# Patient Record
Sex: Male | Born: 1970 | Race: White | Hispanic: No | Marital: Married | Smoking: Never smoker
Health system: Southern US, Community
[De-identification: ages and names within clinical notes are randomized; demographics above are authoritative.]

## PROBLEM LIST (undated history)

## (undated) DIAGNOSIS — N200 Calculus of kidney: Secondary | ICD-10-CM

## (undated) HISTORY — PX: CHOLECYSTECTOMY: SHX55

## (undated) HISTORY — PX: SHOULDER SURGERY: SHX246

## (undated) HISTORY — PX: OTHER SURGICAL HISTORY: SHX169

## (undated) HISTORY — PX: HERNIA REPAIR: SHX51

## (undated) HISTORY — PX: TONSILLECTOMY: SUR1361

---

## 1998-02-25 ENCOUNTER — Encounter: Payer: Self-pay | Admitting: Emergency Medicine

## 1998-02-25 ENCOUNTER — Inpatient Hospital Stay (HOSPITAL_COMMUNITY): Admission: EM | Admit: 1998-02-25 | Discharge: 1998-02-28 | Payer: Self-pay | Admitting: Emergency Medicine

## 2004-11-17 ENCOUNTER — Emergency Department (HOSPITAL_COMMUNITY): Admission: EM | Admit: 2004-11-17 | Discharge: 2004-11-17 | Payer: Self-pay | Admitting: Emergency Medicine

## 2004-11-18 ENCOUNTER — Ambulatory Visit (HOSPITAL_COMMUNITY): Admission: AD | Admit: 2004-11-18 | Discharge: 2004-11-18 | Payer: Self-pay | Admitting: Urology

## 2007-05-29 ENCOUNTER — Encounter: Admission: RE | Admit: 2007-05-29 | Discharge: 2007-05-29 | Payer: Self-pay | Admitting: General Surgery

## 2008-10-01 ENCOUNTER — Encounter: Admission: RE | Admit: 2008-10-01 | Discharge: 2008-10-01 | Payer: Self-pay | Admitting: Orthopedic Surgery

## 2008-10-18 ENCOUNTER — Emergency Department: Payer: Self-pay | Admitting: Emergency Medicine

## 2010-05-25 ENCOUNTER — Emergency Department (HOSPITAL_COMMUNITY)
Admission: EM | Admit: 2010-05-25 | Discharge: 2010-05-25 | Payer: Self-pay | Source: Home / Self Care | Admitting: Emergency Medicine

## 2010-05-31 LAB — CBC
HCT: 40.2 % (ref 39.0–52.0)
Hemoglobin: 13.5 g/dL (ref 13.0–17.0)
MCH: 31.5 pg (ref 26.0–34.0)
MCHC: 33.6 g/dL (ref 30.0–36.0)
MCV: 93.7 fL (ref 78.0–100.0)
Platelets: 218 10*3/uL (ref 150–400)
RBC: 4.29 MIL/uL (ref 4.22–5.81)
RDW: 13.8 % (ref 11.5–15.5)
WBC: 4.9 10*3/uL (ref 4.0–10.5)

## 2010-05-31 LAB — URINALYSIS, ROUTINE W REFLEX MICROSCOPIC
Bilirubin Urine: NEGATIVE
Hgb urine dipstick: NEGATIVE
Ketones, ur: NEGATIVE mg/dL
Nitrite: NEGATIVE
Protein, ur: NEGATIVE mg/dL
Specific Gravity, Urine: 1.011 (ref 1.005–1.030)
Urine Glucose, Fasting: NEGATIVE mg/dL
Urobilinogen, UA: 0.2 mg/dL (ref 0.0–1.0)
pH: 6 (ref 5.0–8.0)

## 2010-05-31 LAB — DIFFERENTIAL
Basophils Absolute: 0 10*3/uL (ref 0.0–0.1)
Basophils Relative: 1 % (ref 0–1)
Eosinophils Absolute: 0.2 10*3/uL (ref 0.0–0.7)
Eosinophils Relative: 5 % (ref 0–5)
Lymphocytes Relative: 36 % (ref 12–46)
Lymphs Abs: 1.8 10*3/uL (ref 0.7–4.0)
Monocytes Absolute: 0.5 10*3/uL (ref 0.1–1.0)
Monocytes Relative: 10 % (ref 3–12)
Neutro Abs: 2.4 10*3/uL (ref 1.7–7.7)
Neutrophils Relative %: 49 % (ref 43–77)

## 2010-05-31 LAB — COMPREHENSIVE METABOLIC PANEL
ALT: 15 U/L (ref 0–53)
AST: 17 U/L (ref 0–37)
Albumin: 4.3 g/dL (ref 3.5–5.2)
Alkaline Phosphatase: 66 U/L (ref 39–117)
BUN: 12 mg/dL (ref 6–23)
CO2: 27 mEq/L (ref 19–32)
Calcium: 9.8 mg/dL (ref 8.4–10.5)
Chloride: 107 mEq/L (ref 96–112)
Creatinine, Ser: 1.11 mg/dL (ref 0.4–1.5)
GFR calc Af Amer: >60 mL/min (ref >60)
GFR calc non Af Amer: >60 mL/min (ref >60)
Glucose, Bld: 79 mg/dL (ref 70–99)
Potassium: 4.1 mEq/L (ref 3.5–5.1)
Sodium: 141 mEq/L (ref 135–145)
Total Bilirubin: 0.7 mg/dL (ref 0.3–1.2)
Total Protein: 7.2 g/dL (ref 6.0–8.3)

## 2012-06-05 ENCOUNTER — Other Ambulatory Visit: Payer: Self-pay | Admitting: Gastroenterology

## 2012-06-05 ENCOUNTER — Ambulatory Visit
Admission: RE | Admit: 2012-06-05 | Discharge: 2012-06-05 | Disposition: A | Discharge status: Home / Self Care | Payer: BC Managed Care – PPO | Source: Ambulatory Visit | Attending: Gastroenterology | Admitting: Gastroenterology

## 2012-06-05 DIAGNOSIS — R1310 Dysphagia, unspecified: Secondary | ICD-10-CM

## 2015-07-28 ENCOUNTER — Encounter: Payer: Self-pay | Admitting: *Deleted

## 2015-07-28 ENCOUNTER — Emergency Department
Admission: EM | Admit: 2015-07-28 | Discharge: 2015-07-28 | Disposition: A | Discharge status: Home / Self Care | Payer: Worker's Compensation | Attending: Student | Admitting: Student

## 2015-07-28 ENCOUNTER — Emergency Department: Payer: Worker's Compensation

## 2015-07-28 DIAGNOSIS — Y998 Other external cause status: Secondary | ICD-10-CM | POA: Diagnosis not present

## 2015-07-28 DIAGNOSIS — S29002A Unspecified injury of muscle and tendon of back wall of thorax, initial encounter: Secondary | ICD-10-CM | POA: Diagnosis not present

## 2015-07-28 DIAGNOSIS — Y92410 Unspecified street and highway as the place of occurrence of the external cause: Secondary | ICD-10-CM | POA: Insufficient documentation

## 2015-07-28 DIAGNOSIS — S2232XA Fracture of one rib, left side, initial encounter for closed fracture: Secondary | ICD-10-CM | POA: Diagnosis not present

## 2015-07-28 DIAGNOSIS — Y9389 Activity, other specified: Secondary | ICD-10-CM | POA: Insufficient documentation

## 2015-07-28 DIAGNOSIS — S199XXA Unspecified injury of neck, initial encounter: Secondary | ICD-10-CM | POA: Diagnosis not present

## 2015-07-28 DIAGNOSIS — S3992XA Unspecified injury of lower back, initial encounter: Secondary | ICD-10-CM | POA: Diagnosis not present

## 2015-07-28 DIAGNOSIS — S29001A Unspecified injury of muscle and tendon of front wall of thorax, initial encounter: Secondary | ICD-10-CM | POA: Diagnosis present

## 2015-07-28 HISTORY — DX: Calculus of kidney: N20.0

## 2015-07-28 LAB — URINE DRUG SCREEN, QUALITATIVE (ARMC ONLY)
Amphetamines, Ur Screen: NOT DETECTED
Barbiturates, Ur Screen: NOT DETECTED
Benzodiazepine, Ur Scrn: NOT DETECTED
Cannabinoid 50 Ng, Ur ~~LOC~~: NOT DETECTED
Cocaine Metabolite,Ur ~~LOC~~: NOT DETECTED
MDMA (Ecstasy)Ur Screen: NOT DETECTED
Methadone Scn, Ur: NOT DETECTED
Opiate, Ur Screen: NOT DETECTED
Phencyclidine (PCP) Ur S: NOT DETECTED
Tricyclic, Ur Screen: NOT DETECTED

## 2015-07-28 MED ORDER — IBUPROFEN 600 MG PO TABS
600.0000 mg | ORAL_TABLET | Freq: Once | ORAL | Status: AC
  Administered 2015-07-28: 600 mg via ORAL
  Filled 2015-07-28: qty 1

## 2015-07-28 MED ORDER — OXYCODONE HCL 5 MG PO TABS: 5.0000 mg | ORAL_TABLET | Freq: Four times a day (QID) | ORAL | Status: AC | PRN
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
## 2016-03-06 ENCOUNTER — Emergency Department (HOSPITAL_COMMUNITY): Payer: BLUE CROSS/BLUE SHIELD

## 2016-03-06 ENCOUNTER — Emergency Department (HOSPITAL_COMMUNITY)
Admission: EM | Admit: 2016-03-06 | Discharge: 2016-03-07 | Disposition: A | Discharge status: Home / Self Care | Payer: BLUE CROSS/BLUE SHIELD | Attending: Emergency Medicine | Admitting: Emergency Medicine

## 2016-03-06 ENCOUNTER — Encounter (HOSPITAL_COMMUNITY): Payer: Self-pay | Admitting: Emergency Medicine

## 2016-03-06 DIAGNOSIS — Z79899 Other long term (current) drug therapy: Secondary | ICD-10-CM | POA: Diagnosis not present

## 2016-03-06 DIAGNOSIS — N200 Calculus of kidney: Secondary | ICD-10-CM | POA: Insufficient documentation

## 2016-03-06 DIAGNOSIS — R109 Unspecified abdominal pain: Secondary | ICD-10-CM | POA: Diagnosis present

## 2016-03-06 DIAGNOSIS — R319 Hematuria, unspecified: Secondary | ICD-10-CM | POA: Insufficient documentation

## 2016-03-06 LAB — I-STAT CHEM 8, ED
BUN: 17 mg/dL (ref 6–20)
CHLORIDE: 104 mmol/L (ref 101–111)
Calcium, Ion: 1.15 mmol/L (ref 1.15–1.40)
Creatinine, Ser: 1 mg/dL (ref 0.61–1.24)
GLUCOSE: 99 mg/dL (ref 65–99)
HCT: 43 % (ref 39.0–52.0)
Hemoglobin: 14.6 g/dL (ref 13.0–17.0)
POTASSIUM: 4 mmol/L (ref 3.5–5.1)
Sodium: 140 mmol/L (ref 135–145)
TCO2: 25 mmol/L (ref 0–100)

## 2016-03-06 LAB — URINE MICROSCOPIC-ADD ON
BACTERIA UA: NONE SEEN
SQUAMOUS EPITHELIAL / LPF: NONE SEEN

## 2016-03-06 LAB — CBC WITH DIFFERENTIAL/PLATELET
BASOS ABS: 0 10*3/uL (ref 0.0–0.1)
Basophils Relative: 0 %
EOS PCT: 2 %
Eosinophils Absolute: 0.1 10*3/uL (ref 0.0–0.7)
HEMATOCRIT: 42.9 % (ref 39.0–52.0)
Hemoglobin: 15 g/dL (ref 13.0–17.0)
LYMPHS PCT: 17 %
Lymphs Abs: 1.2 10*3/uL (ref 0.7–4.0)
MCH: 31.5 pg (ref 26.0–34.0)
MCHC: 35 g/dL (ref 30.0–36.0)
MCV: 90.1 fL (ref 78.0–100.0)
Monocytes Absolute: 0.5 10*3/uL (ref 0.1–1.0)
Monocytes Relative: 7 %
NEUTROS ABS: 5.3 10*3/uL (ref 1.7–7.7)
Neutrophils Relative %: 74 %
PLATELETS: 210 10*3/uL (ref 150–400)
RBC: 4.76 MIL/uL (ref 4.22–5.81)
RDW: 12.8 % (ref 11.5–15.5)
WBC: 7.1 10*3/uL (ref 4.0–10.5)

## 2016-03-06 LAB — URINALYSIS, ROUTINE W REFLEX MICROSCOPIC
Bilirubin Urine: NEGATIVE
Glucose, UA: NEGATIVE mg/dL
Ketones, ur: NEGATIVE mg/dL
Leukocytes, UA: NEGATIVE
Nitrite: NEGATIVE
Protein, ur: NEGATIVE mg/dL
Specific Gravity, Urine: 1.006 (ref 1.005–1.030)
pH: 5.5 (ref 5.0–8.0)

## 2016-03-06 MED ORDER — OXYCODONE-ACETAMINOPHEN 5-325 MG PO TABS
1.0000 | ORAL_TABLET | ORAL | Status: DC | PRN
  Administered 2016-03-06: 1 via ORAL
  Filled 2016-03-06: qty 1

## 2016-03-06 MED ORDER — HYDROMORPHONE HCL 1 MG/ML IJ SOLN
0.5000 mg | Freq: Once | INTRAMUSCULAR | Status: AC
  Administered 2016-03-06: 0.5 mg via INTRAVENOUS
  Filled 2016-03-06: qty 1

## 2016-03-06 MED ORDER — ONDANSETRON HCL 4 MG/2ML IJ SOLN
4.0000 mg | Freq: Once | INTRAMUSCULAR | Status: AC
  Administered 2016-03-06: 4 mg via INTRAVENOUS
  Filled 2016-03-06: qty 2

## 2016-03-06 MED ORDER — KETOROLAC TROMETHAMINE 30 MG/ML IJ SOLN
15.0000 mg | Freq: Once | INTRAMUSCULAR | Status: AC
  Administered 2016-03-06: 15 mg via INTRAVENOUS
  Filled 2016-03-06: qty 1

## 2016-03-06 NOTE — ED Triage Notes (Signed)
Pt states that he has had R sided flank pain since 11 oclock this morning. Hx of kidney stones in the past. Alert and oriented.

## 2016-03-06 NOTE — ED Notes (Signed)
Bed: [id number] Expected date: 03/06/16 Expected time: 5:39 PM Means of arrival: Ambulance Comments: N/V

## 2016-03-06 NOTE — ED Notes (Signed)
Pt reports that his normal BP is around 90/60 or lower and his HR is around 60 or lower.

## 2016-03-06 NOTE — ED Provider Notes (Addendum)
[facility]-EMERGENCY DEPT Provider Note   CSN: [id number] Arrival date & time: 03/06/16  1659     History   Chief Complaint Chief Complaint  Patient presents with  . Flank Pain    HPI Sean Gonzales is a 45 y.o. male.  The history is provided by the patient.  Flank Pain  Pertinent negatives include no chest pain and no abdominal pain.  Patient presents with right flank pain. Began yesterday but worse today. Was somewhat dull yesterday but at around 11:00 today a more severe. Feels like previous kidney stones. Last stone was several years ago. Sees Dr. Wynelle Link from [facility]. His had nausea. Had Percocet through triage and states he feels little better. No diarrhea or constipation.  Past Medical History:  Diagnosis Date  . Kidney stones     There are no active problems to display for this patient.   Past Surgical History:  Procedure Laterality Date  . CHOLECYSTECTOMY    . HERNIA REPAIR    . kidney stone removal    . SHOULDER SURGERY Right   . TONSILLECTOMY         Home Medications    Prior to Admission medications   Medication Sig Start Date End Date Taking? Authorizing Provider  ibuprofen (ADVIL,MOTRIN) 200 MG tablet Take 400 mg by mouth every 6 (six) hours as needed for headache.   Yes Historical Provider, MD  oxyCODONE (ROXICODONE) 5 MG immediate release tablet Take 1 tablet (5 mg total) by mouth every 6 (six) hours as needed for moderate pain. Do not drive while taking this medication. Patient not taking: Reported on 03/06/2016 07/28/15   Gayla Doss, MD    Family History History reviewed. No pertinent family history.  Social History Social History  Substance Use Topics  . Smoking status: Never Smoker  . Smokeless tobacco: Not on file  . Alcohol use Yes     Comment: occasional     Allergies   Review of patient's allergies indicates no known allergies.   Review of Systems Review of Systems  Constitutional: Negative for appetite change.    Respiratory: Negative for chest tightness.   Cardiovascular: Negative for chest pain.  Gastrointestinal: Positive for nausea. Negative for abdominal pain.  Genitourinary: Positive for flank pain.  Musculoskeletal: Negative for back pain.  Neurological: Negative for light-headedness.     Physical Exam Updated Vital Signs BP 122/84   Pulse (!) 52   Temp 98.5 F (36.9 C) (Oral)   Resp 18   Ht 5\' 8"  (1.727 m)   Wt 188 lb (85.3 kg)   SpO2 99%   BMI 28.59 kg/m   Physical Exam  Constitutional: He appears well-developed.  HENT:  Head: Atraumatic.  Eyes: EOM are normal.  Neck: Neck supple.  Cardiovascular: Normal rate.   Pulmonary/Chest: Effort normal.  Abdominal: He exhibits no distension.  Genitourinary:  Genitourinary Comments: No CVA tenderness.  Musculoskeletal: Normal range of motion.  Neurological: He is alert.  Skin: Skin is warm. Capillary refill takes less than 2 seconds.     ED Treatments / Results  Labs (all labs ordered are listed, but only abnormal results are displayed) Labs Reviewed  URINALYSIS, ROUTINE W REFLEX MICROSCOPIC (NOT AT [facility]) - Abnormal; Notable for the following:       Result Value   Hgb urine dipstick LARGE (*)    All other components within normal limits  CBC WITH DIFFERENTIAL/PLATELET  URINE MICROSCOPIC-ADD ON  I-STAT CHEM 8, ED    EKG  EKG  Interpretation None       Radiology Dg Abdomen 1 View  Result Date: 03/06/2016 CLINICAL DATA:  Right-sided low back and flank pain for 2 days. History of kidney stones. EXAM: ABDOMEN - 1 VIEW COMPARISON:  Lumbar spine radiographs 07/28/2015. One view abdomen 04/23/2014. FINDINGS: There are no suspicious calcifications overlying the kidneys or expected course of the right ureter. Left pelvic calcification is unchanged, likely a phlebolith. There is a faint radiodensity adjacent to the left L4 transverse process which could reflect a calcification. The bowel gas pattern is normal.  Cholecystectomy clips and bone island in the left superior pubic ramus are unchanged. IMPRESSION: No acute findings. No radiographic evidence of right-sided urinary tract calculus. Potential small calcification adjacent to the left L4 transverse process. Electronically Signed   By: Carey BullocksWilliam  Veazey M.D.   On: 03/06/2016 18:31   [place] Renal  Result Date: 03/06/2016 CLINICAL DATA:  Right flank pain since this morning. EXAM: RENAL / URINARY TRACT ULTRASOUND COMPLETE COMPARISON:  None. FINDINGS: Right Kidney: Length: 10.6 cm. Echogenicity within normal limits. No mass or hydronephrosis visualized. Left Kidney: Length: 10.8 cm. Echogenicity within normal limits. No mass or hydronephrosis visualized. Bladder: Appears normal for degree of bladder distention. IMPRESSION: Normal size kidneys without hydronephrosis. Electronically Signed   By: Elberta Fortisaniel  Boyle M.D.   On: 03/06/2016 21:07    Procedures Procedures (including critical care time)  Medications Ordered in ED Medications  oxyCODONE-acetaminophen (PERCOCET/ROXICET) 5-325 MG per tablet 1 tablet (1 tablet Oral Given 03/06/16 1710)  HYDROmorphone (DILAUDID) injection 0.5 mg (not administered)  HYDROmorphone (DILAUDID) injection 0.5 mg (0.5 mg Intravenous Given 03/06/16 1826)  HYDROmorphone (DILAUDID) injection 0.5 mg (0.5 mg Intravenous Given 03/06/16 2012)  ketorolac (TORADOL) 30 MG/ML injection 15 mg (15 mg Intravenous Given 03/06/16 2012)  ondansetron (ZOFRAN) injection 4 mg (4 mg Intravenous Given 03/06/16 2012)  HYDROmorphone (DILAUDID) injection 0.5 mg (0.5 mg Intravenous Given 03/06/16 2243)  iopamidol (ISOVUE-300) 61 % injection 100 mL (100 mLs Intravenous Contrast Given 03/07/16 0032)     Initial Impression / Assessment and Plan / ED Course  I have reviewed the triage vital signs and the nursing notes.  Pertinent labs & imaging results that were available during my care of the patient were reviewed by me and considered in my medical decision  making (see chart for details).  Clinical Course    Patient with right flank pain. Feels like previous kidney stones. Does have hematuria however KUB did not show stone on the right side. Renal ultrasound did not show hydronephrosis. CT scan was done with contrast. His been required repeat doses of pain medicine. Care turned over to Dr Jacqulyn BathLong. Patient states he has never passed a stone on its own and has had stents and retrieval in the past.  Final Clinical Impressions(s) / ED Diagnoses   Final diagnoses:  Right flank pain  Hematuria, unspecified type    New Prescriptions New Prescriptions   No medications on file     Benjiman CoreNathan Breylan Lefevers, MD 03/07/16 0041    Benjiman CoreNathan Nahiara Kretzschmar, MD 03/07/16 [phone]

## 2016-03-06 NOTE — ED Triage Notes (Signed)
Pain medication given in Triage. Patient advised about side effects of medications and  to avoid driving for a minimum of 4 hours.  

## 2016-03-06 NOTE — ED Notes (Signed)
US at bedside

## 2016-03-06 NOTE — ED Notes (Signed)
RN at bedside starting IV will collect labs 

## 2016-03-07 ENCOUNTER — Emergency Department (HOSPITAL_COMMUNITY): Payer: BLUE CROSS/BLUE SHIELD

## 2016-03-07 ENCOUNTER — Encounter (HOSPITAL_COMMUNITY): Payer: Self-pay

## 2016-03-07 DIAGNOSIS — N200 Calculus of kidney: Secondary | ICD-10-CM | POA: Diagnosis not present

## 2016-03-07 MED ORDER — TAMSULOSIN HCL 0.4 MG PO CAPS: 0.4000 mg | ORAL_CAPSULE | Freq: Every day | ORAL | 0 refills | Status: DC

## 2016-03-07 MED ORDER — OXYCODONE-ACETAMINOPHEN 5-325 MG PO TABS: 1.0000 | ORAL_TABLET | ORAL | 0 refills | Status: DC | PRN

## 2016-03-07 MED ORDER — HYDROMORPHONE HCL 1 MG/ML IJ SOLN
0.5000 mg | Freq: Once | INTRAMUSCULAR | Status: AC
  Administered 2016-03-07: 0.5 mg via INTRAVENOUS
  Filled 2016-03-07: qty 1

## 2016-03-07 MED ORDER — IOPAMIDOL (ISOVUE-300) INJECTION 61%
100.0000 mL | Freq: Once | INTRAVENOUS | Status: AC | PRN
  Administered 2016-03-07: 100 mL via INTRAVENOUS

## 2016-03-07 NOTE — Discharge Instructions (Signed)
You have been seen in the Emergency Department (ED) today for pain that we believe based on your workup, is caused by kidney stones.  As we have discussed, please drink plenty of fluids.  Please make a follow up appointment with the physician(s) listed elsewhere in this documentation. ° °You may take pain medication as needed but ONLY as prescribed.  Please also take your prescribed Flomax daily.  We also recommend that you take over-the-counter ibuprofen regularly according to label instructions over the next 5 days.  Take it with meals to minimize stomach discomfort. ° °Please see your doctor as soon as possible as stones may take 1-3 weeks to pass and you may require additional care or medications. ° °Do not drink alcohol, drive or participate in any other potentially dangerous activities while taking opiate pain medication as it may make you sleepy. Do not take this medication with any other sedating medications, either prescription or over-the-counter. If you were prescribed Percocet or Vicodin, do not take these with acetaminophen (Tylenol) as it is already contained within these medications. °  °Take Percocet as needed for severe pain.  This medication is an opiate (or narcotic) pain medication and can be habit forming.  Use it as little as possible to achieve adequate pain control.  Do not use or use it with extreme caution if you have a history of opiate abuse or dependence.  If you are on a pain contract with your primary care doctor or a pain specialist, be sure to let them know you were prescribed this medication today from the Emergency Department.  This medication is intended for your use only - do not give any to anyone else and keep it in a secure place where nobody else, especially children, have access to it.  It will also cause or worsen constipation, so you may want to consider taking an over-the-counter stool softener while you are taking this medication. ° °Return to the Emergency Department  (ED) or call your doctor if you have any worsening pain, fever, painful urination, are unable to urinate, or develop other symptoms that concern you. ° ° ° °Kidney Stones °Kidney stones (urolithiasis) are deposits that form inside your kidneys. The intense pain is caused by the stone moving through the urinary tract. When the stone moves, the ureter goes into spasm around the stone. The stone is usually passed in the urine.  °CAUSES  °A disorder that makes certain neck glands produce too much parathyroid hormone (primary hyperparathyroidism). °A buildup of uric acid crystals, similar to gout in your joints. °Narrowing (stricture) of the ureter. °A kidney obstruction present at birth (congenital obstruction). °Previous surgery on the kidney or ureters. °Numerous kidney infections. °SYMPTOMS  °Feeling sick to your stomach (nauseous). °Throwing up (vomiting). °Blood in the urine (hematuria). °Pain that usually spreads (radiates) to the groin. °Frequency or urgency of urination. °DIAGNOSIS  °Taking a history and physical exam. °Blood or urine tests. °CT scan. °Occasionally, an examination of the inside of the urinary bladder (cystoscopy) is performed. °TREATMENT  °Observation. °Increasing your fluid intake. °Extracorporeal shock wave lithotripsy--This is a noninvasive procedure that uses shock waves to break up kidney stones. °Surgery may be needed if you have severe pain or persistent obstruction. There are various surgical procedures. Most of the procedures are performed with the use of small instruments. Only small incisions are needed to accommodate these instruments, so recovery time is minimized. °The size, location, and chemical composition are all important variables that will determine the proper   choice of action for you. Talk to your health care provider to better understand your situation so that you will minimize the risk of injury to yourself and your kidney.  °HOME CARE INSTRUCTIONS  °Drink enough water  and fluids to keep your urine clear or pale yellow. This will help you to pass the stone or stone fragments. °Strain all urine through the provided strainer. Keep all particulate matter and stones for your health care provider to see. The stone causing the pain may be as small as a grain of salt. It is very important to use the strainer each and every time you pass your urine. The collection of your stone will allow your health care provider to analyze it and verify that a stone has actually passed. The stone analysis will often identify what you can do to reduce the incidence of recurrences. °Only take over-the-counter or prescription medicines for pain, discomfort, or fever as directed by your health care provider. °Keep all follow-up visits as told by your health care provider. This is important. °Get follow-up X-rays if required. The absence of pain does not always mean that the stone has passed. It may have only stopped moving. If the urine remains completely obstructed, it can cause loss of kidney function or even complete destruction of the kidney. It is your responsibility to make sure X-rays and follow-ups are completed. Ultrasounds of the kidney can show blockages and the status of the kidney. Ultrasounds are not associated with any radiation and can be performed easily in a matter of minutes. °Make changes to your daily diet as told by your health care provider. You may be told to: °Limit the amount of salt that you eat. °Eat 5 or more servings of fruits and vegetables each day. °Limit the amount of meat, poultry, fish, and eggs that you eat. °Collect a 24-hour urine sample as told by your health care provider. You may need to collect another urine sample every 6-12 months. °SEEK MEDICAL CARE IF: °You experience pain that is progressive and unresponsive to any pain medicine you have been prescribed. °SEEK IMMEDIATE MEDICAL CARE IF:  °Pain cannot be controlled with the prescribed medicine. °You have a  fever or shaking chills. °The severity or intensity of pain increases over 18 hours and is not relieved by pain medicine. °You develop a new onset of abdominal pain. °You feel faint or pass out. °You are unable to urinate. °  °This information is not intended to replace advice given to you by your health care provider. Make sure you discuss any questions you have with your health care provider. °  °Document Released: 05/02/2005 Document Revised: 01/21/2015 Document Reviewed: 10/03/2012 °Elsevier Interactive Patient Education ©2016 [employer] ° ° °

## 2016-03-07 NOTE — ED Provider Notes (Signed)
Blood pressure 104/69, pulse 92, temperature 98.5 F (36.9 C), temperature source Oral, resp. rate 18, height 5\' 8"  (1.727 m), weight 188 lb (85.3 kg), SpO2 93 %.  Assuming care from Dr. Rubin PayorPickering.  In short, Sean Gonzales is a 45 y.o. male with a chief complaint of Flank Pain .  Refer to the original H&P for additional details.  The current plan of care is to follow CT scan and reassess.  03:05 AM Patient has a 3 mm distal right ureter stone with mild right hydronephrosis. No fever or other objective signs of infection. The patient's pain is well-controlled at this time. He has a urologist in the community and will call them later this morning. Discussed return precautions in detail. Patient is pleased at discharge.   Alona BeneJoshua Jalyne Brodzinski, MD    Maia PlanJoshua G Catrina Fellenz, MD 03/07/16 [phone]

## 2016-03-07 NOTE — ED Notes (Signed)
MD at bedside. 

## 2018-08-10 ENCOUNTER — Emergency Department
Admission: EM | Admit: 2018-08-10 | Discharge: 2018-08-10 | Disposition: A | Discharge status: Home / Self Care | Payer: BLUE CROSS/BLUE SHIELD | Attending: Emergency Medicine | Admitting: Emergency Medicine

## 2018-08-10 ENCOUNTER — Emergency Department: Payer: BLUE CROSS/BLUE SHIELD

## 2018-08-10 ENCOUNTER — Other Ambulatory Visit: Payer: Self-pay

## 2018-08-10 ENCOUNTER — Encounter: Payer: Self-pay | Admitting: Emergency Medicine

## 2018-08-10 DIAGNOSIS — R1012 Left upper quadrant pain: Secondary | ICD-10-CM | POA: Diagnosis present

## 2018-08-10 DIAGNOSIS — N23 Unspecified renal colic: Secondary | ICD-10-CM | POA: Diagnosis not present

## 2018-08-10 DIAGNOSIS — Z79899 Other long term (current) drug therapy: Secondary | ICD-10-CM | POA: Diagnosis not present

## 2018-08-10 LAB — URINALYSIS, COMPLETE (UACMP) WITH MICROSCOPIC
BACTERIA UA: NONE SEEN
BILIRUBIN URINE: NEGATIVE
Glucose, UA: NEGATIVE mg/dL
HGB URINE DIPSTICK: NEGATIVE
KETONES UR: 20 mg/dL — AB
LEUKOCYTE UA: NEGATIVE
NITRITE: NEGATIVE
PROTEIN: NEGATIVE mg/dL
Specific Gravity, Urine: 1.025 (ref 1.005–1.030)
pH: 5 (ref 5.0–8.0)

## 2018-08-10 LAB — CBC WITH DIFFERENTIAL/PLATELET
Abs Immature Granulocytes: 0.04 10*3/uL (ref 0.00–0.07)
BASOS ABS: 0 10*3/uL (ref 0.0–0.1)
Basophils Relative: 0 %
Eosinophils Absolute: 0 10*3/uL (ref 0.0–0.5)
Eosinophils Relative: 0 %
HEMATOCRIT: 44 % (ref 39.0–52.0)
HEMOGLOBIN: 15.2 g/dL (ref 13.0–17.0)
IMMATURE GRANULOCYTES: 0 %
LYMPHS ABS: 0.8 10*3/uL (ref 0.7–4.0)
LYMPHS PCT: 7 %
MCH: 31.1 pg (ref 26.0–34.0)
MCHC: 34.5 g/dL (ref 30.0–36.0)
MCV: 90.2 fL (ref 80.0–100.0)
MONOS PCT: 5 %
Monocytes Absolute: 0.6 10*3/uL (ref 0.1–1.0)
NRBC: 0 % (ref 0.0–0.2)
Neutro Abs: 10.3 10*3/uL — ABNORMAL HIGH (ref 1.7–7.7)
Neutrophils Relative %: 88 %
Platelets: 220 10*3/uL (ref 150–400)
RBC: 4.88 MIL/uL (ref 4.22–5.81)
RDW: 12.4 % (ref 11.5–15.5)
WBC: 11.7 10*3/uL — ABNORMAL HIGH (ref 4.0–10.5)

## 2018-08-10 LAB — BASIC METABOLIC PANEL
ANION GAP: 12 (ref 5–15)
BUN: 20 mg/dL (ref 6–20)
CHLORIDE: 105 mmol/L (ref 98–111)
CO2: 22 mmol/L (ref 22–32)
Calcium: 9.6 mg/dL (ref 8.9–10.3)
Creatinine, Ser: 1.54 mg/dL — ABNORMAL HIGH (ref 0.61–1.24)
GFR calc non Af Amer: 54 mL/min — ABNORMAL LOW (ref >60)
GLUCOSE: 153 mg/dL — AB (ref 70–99)
Potassium: 4.1 mmol/L (ref 3.5–5.1)
Sodium: 139 mmol/L (ref 135–145)

## 2018-08-10 MED ORDER — ONDANSETRON HCL 4 MG/2ML IJ SOLN
4.0000 mg | Freq: Once | INTRAMUSCULAR | Status: AC
  Administered 2018-08-10: 4 mg via INTRAVENOUS
  Filled 2018-08-10: qty 2

## 2018-08-10 MED ORDER — HYDROMORPHONE HCL 1 MG/ML IJ SOLN
1.0000 mg | Freq: Once | INTRAMUSCULAR | Status: AC
Start: 2018-08-10 — End: 2018-08-10
  Administered 2018-08-10: 1 mg via INTRAVENOUS
  Filled 2018-08-10: qty 1

## 2018-08-10 MED ORDER — OXYCODONE-ACETAMINOPHEN 5-325 MG PO TABS: 1.0000 | ORAL_TABLET | Freq: Once | ORAL | Status: DC

## 2018-08-10 MED ORDER — KETOROLAC TROMETHAMINE 30 MG/ML IJ SOLN
30.0000 mg | Freq: Once | INTRAMUSCULAR | Status: AC
  Administered 2018-08-10: 30 mg via INTRAVENOUS
  Filled 2018-08-10: qty 1

## 2018-08-10 MED ORDER — ONDANSETRON 4 MG PO TBDP
4.0000 mg | ORAL_TABLET | Freq: Once | ORAL | Status: AC
  Administered 2018-08-10: 4 mg via ORAL
  Filled 2018-08-10: qty 1

## 2018-08-10 MED ORDER — TAMSULOSIN HCL 0.4 MG PO CAPS
0.4000 mg | ORAL_CAPSULE | Freq: Once | ORAL | Status: AC
  Administered 2018-08-10: 0.4 mg via ORAL
  Filled 2018-08-10: qty 1

## 2018-08-10 MED ORDER — TAMSULOSIN HCL 0.4 MG PO CAPS: 0.4000 mg | ORAL_CAPSULE | Freq: Every day | ORAL | 0 refills | Status: AC

## 2018-08-10 MED ORDER — HYDROMORPHONE HCL 1 MG/ML IJ SOLN
1.0000 mg | Freq: Once | INTRAMUSCULAR | Status: AC
  Administered 2018-08-10: 1 mg via INTRAVENOUS
  Filled 2018-08-10: qty 1

## 2018-08-10 MED ORDER — OXYCODONE-ACETAMINOPHEN 5-325 MG PO TABS
1.0000 | ORAL_TABLET | Freq: Once | ORAL | Status: AC
  Administered 2018-08-10: 1 via ORAL
  Filled 2018-08-10: qty 1

## 2018-08-10 MED ORDER — OXYCODONE-ACETAMINOPHEN 5-325 MG PO TABS: 1.0000 | ORAL_TABLET | ORAL | 0 refills | Status: AC | PRN

## 2018-08-10 MED ORDER — SODIUM CHLORIDE 0.9 % IV BOLUS
1000.0000 mL | Freq: Once | INTRAVENOUS | Status: AC
Start: 2018-08-10 — End: 2018-08-10
  Administered 2018-08-10: 1000 mL via INTRAVENOUS

## 2018-08-10 MED ORDER — ONDANSETRON 4 MG PO TBDP: 4.0000 mg | ORAL_TABLET | Freq: Three times a day (TID) | ORAL | 0 refills | Status: AC | PRN

## 2018-08-10 NOTE — ED Provider Notes (Signed)
[facility] Emergency Department Provider Note   ____________________________________________   First MD Initiated Contact with Patient 08/10/18 0023     (approximate)  I have reviewed the triage vital signs and the nursing notes.   HISTORY  Chief Complaint Flank Pain    HPI Sean Gonzales is a 48 y.o. male presents to the ED from home with a chief complaint of left flank pain.  Patient has a history of kidney stones and began to have pain yesterday.  Has an appointment scheduled with urology in [place] this morning but states pain increased and now associated with nausea and vomiting.  Patient denies fever, chills, chest pain, shortness of breath, abdominal pain, hematuria, testicular pain or swelling.  Patient recently traveled to [place] from March 3-13 but has not had any symptoms consistent with coronavirus.  Denies trauma or anticoagulation use.       Past Medical History:  Diagnosis Date  . Kidney stones     There are no active problems to display for this patient.   Past Surgical History:  Procedure Laterality Date  . CHOLECYSTECTOMY    . HERNIA REPAIR    . kidney stone removal    . SHOULDER SURGERY Right   . TONSILLECTOMY      Prior to Admission medications   Medication Sig Start Date End Date Taking? Authorizing Provider  ibuprofen (ADVIL,MOTRIN) 200 MG tablet Take 400 mg by mouth every 6 (six) hours as needed for headache.    [provider]  ondansetron (ZOFRAN ODT) 4 MG disintegrating tablet Take 1 tablet (4 mg total) by mouth every 8 (eight) hours as needed for nausea or vomiting. 08/10/18   Irean Hong, MD  oxyCODONE (ROXICODONE) 5 MG immediate release tablet Take 1 tablet (5 mg total) by mouth every 6 (six) hours as needed for moderate pain. Do not drive while taking this medication. Patient not taking: Reported on 03/06/2016 07/28/15   Gayla Doss, MD  oxyCODONE-acetaminophen (PERCOCET/ROXICET) 5-325 MG  tablet Take 1 tablet by mouth every 4 (four) hours as needed for severe pain. 08/10/18   Irean Hong, MD  tamsulosin (FLOMAX) 0.4 MG CAPS capsule Take 1 capsule (0.4 mg total) by mouth daily. 08/10/18   Irean Hong, MD    Allergies Patient has no known allergies.  No family history on file.  Social History Social History   Tobacco Use  . Smoking status: Never Smoker  . Smokeless tobacco: Never Used  Substance Use Topics  . Alcohol use: Yes    Comment: occasional  . Drug use: Not on file    Review of Systems  Constitutional: No fever/chills Eyes: No visual changes. ENT: No sore throat. Cardiovascular: Denies chest pain. Respiratory: Denies shortness of breath. Gastrointestinal: Positive for left flank pain.  No abdominal pain.  Positive for nausea and vomiting.  No diarrhea.  No constipation. Genitourinary: Negative for dysuria. Musculoskeletal: Negative for back pain. Skin: Negative for rash. Neurological: Negative for headaches, focal weakness or numbness.   ____________________________________________   PHYSICAL EXAM:  VITAL SIGNS: ED Triage Vitals  Enc Vitals Group     BP 08/10/18 0020 109/83     Pulse Rate 08/10/18 0020 98     Resp 08/10/18 0020 18     Temp 08/10/18 0020 98.6 F (37 C)     Temp Source 08/10/18 0020 Oral     SpO2 08/10/18 0020 100 %     Weight 08/10/18 0019 187 lb (84.8 kg)  Height 08/10/18 0019  (1.727 m)     Head Circumference --      Peak Flow --      Pain Score 08/10/18 0019 10     Pain Loc --      Pain Edu? --      Excl. in GC? --     Constitutional: Alert and oriented. Well appearing and in mild to moderate acute distress. Eyes: Conjunctivae are normal. PERRL. EOMI. Head: Atraumatic. Nose: No congestion/rhinnorhea. Mouth/Throat: Mucous membranes are moist.  Oropharynx non-erythematous. Neck: No stridor.   Cardiovascular: Normal rate, regular rhythm. Grossly normal heart sounds.  Good peripheral circulation.  Respiratory: Normal respiratory effort.  No retractions. Lungs CTAB. Gastrointestinal: Soft and mildly tender to palpation left lower quadrant without rebound or guarding. No distention. No abdominal bruits.  Mild left CVA tenderness. Musculoskeletal: No lower extremity tenderness nor edema.  No joint effusions. Neurologic:  Normal speech and language. No gross focal neurologic deficits are appreciated. No gait instability. Skin:  Skin is warm, dry and intact. No rash noted.  No vesicles. Psychiatric: Mood and affect are normal. Speech and behavior are normal.  ____________________________________________   LABS (all labs ordered are listed, but only abnormal results are displayed)  Labs Reviewed  CBC WITH DIFFERENTIAL/PLATELET - Abnormal; Notable for the following components:      Result Value   WBC 11.7 (*)    Neutro Abs 10.3 (*)    All other components within normal limits  BASIC METABOLIC PANEL - Abnormal; Notable for the following components:   Glucose, Bld 153 (*)    Creatinine, Ser 1.54 (*)    GFR calc non Af Amer 54 (*)    All other components within normal limits  URINALYSIS, COMPLETE (UACMP) WITH MICROSCOPIC - Abnormal; Notable for the following components:   Color, Urine AMBER (*)    APPearance CLOUDY (*)    Ketones, ur 20 (*)    All other components within normal limits   ____________________________________________  EKG  None ____________________________________________  RADIOLOGY  ED MD interpretation: 5 x 3 mm left mid ureteral stone with moderate hydronephrosis and perinephric edema  Official radiology report(s): Ct Renal Stone Study  Result Date: 08/10/2018 CLINICAL DATA:  Left flank pain. EXAM: CT ABDOMEN AND PELVIS WITHOUT CONTRAST TECHNIQUE: Multidetector CT imaging of the abdomen and pelvis was performed following the standard protocol without IV contrast. COMPARISON:  CT 03/07/2016 FINDINGS: Lower chest: The lung bases are clear. Hepatobiliary: Tiny  subcentimeter low-density in the right lobe too small to characterize but likely small cyst. Postcholecystectomy. Proximal biliary dilatation with common bile duct measuring 16 mm with normal tapering to the duodenal insertion. No visualized choledocholithiasis. Pancreas: No ductal dilatation or inflammation. Spleen: Normal in size without focal abnormality. Adrenals/Urinary Tract: Normal adrenal glands. Obstructing 5 x 3 mm stone in the left mid ureter (the level of L5) with moderate proximal hydroureteronephrosis and perinephric edema. More distal ureter is decompressed. Additional punctate nonobstructing stones in the left kidney. No right hydronephrosis. No definite right urolithiasis. Urinary bladder is partially distended. No bladder stone Stomach/Bowel: Small hiatal hernia. Stomach partially distended. No bowel wall thickening or inflammatory change. Normal appendix. Small volume of stool throughout the colon. Vascular/Lymphatic: Normal course and caliber of unopacified vasculature. No adenopathy. Reproductive: Prostate is unremarkable. Other: Tiny fat containing umbilical hernia. No free air or free fluid. Musculoskeletal: There are no acute or suspicious osseous abnormalities. IMPRESSION: 1. Obstructing 5 x 3 mm stone in the left mid ureter with moderate  hydronephrosis and perinephric edema. 2. Punctate nonobstructing stones in the left kidney. 3. Small hiatal hernia Electronically Signed   By: Narda Rutherford M.D.   On: 08/10/2018 01:00    ____________________________________________   PROCEDURES  Procedure(s) performed (including Critical Care):  Procedures   ____________________________________________   INITIAL IMPRESSION / ASSESSMENT AND PLAN / ED COURSE  As part of my medical decision making, I reviewed the following data within the electronic MEDICAL RECORD NUMBER Nursing notes reviewed and incorporated, Labs reviewed, Old chart reviewed, Radiograph reviewed and Notes from prior ED  visits        48 year old male with history of nephrolithiasis who presents with left flank pain. Differential diagnosis includes, but is not limited to, acute appendicitis, renal colic, testicular torsion, urinary tract infection/pyelonephritis, prostatitis,  epididymitis, diverticulitis, small bowel obstruction or ileus, colitis, abdominal aortic aneurysm, gastroenteritis, hernia, etc.  Will obtain lab work, urinalysis.  Initiate IV fluid resuscitation, 1 mg IV Dilaudid for pain, paired with 4 mg IV Zofran for nausea.  Given patient's history of large stones requiring lithotripsy, will obtain CT renal colic study.   Clinical Course as of Aug 10 539  Fri Aug 10, 2018  0058 Pain returned upon coming back from CT scan.     [JS]  0256 Pain down to 2/10.  Awaiting urinalysis result.   [JS]  [id number] Updated patient on urinalysis results.  Will discharge home on Flomax, Percocet and Zofran to use as needed.  Encourage patient to keep his urology appointment this morning.  Strict return precautions given.  Patient verbalizes understanding agrees with plan of care.   [JS]  0403 Patient's discharge was delayed secondary to vomiting.  He had a Zofran and is now feeling much better.  Nausea and pain are under better control.  Has a urology appointment in 4 hours.  I printed out a copy of his lab work and CT scan to take to his urologist.  Strongly encouraged him to keep this appointment.   [JS]    Clinical Course User Index [JS] Irean Hong, MD     ____________________________________________   FINAL CLINICAL IMPRESSION(S) / ED DIAGNOSES  Final diagnoses:  Ureteral colic     ED Discharge Orders         Ordered    tamsulosin (FLOMAX) 0.4 MG CAPS capsule  Daily     08/10/18 0323    oxyCODONE-acetaminophen (PERCOCET/ROXICET) 5-325 MG tablet  Every 4 hours PRN     08/10/18 0323    ondansetron (ZOFRAN ODT) 4 MG disintegrating tablet  Every 8 hours PRN     08/10/18 0323            Note:  This document was prepared using Dragon voice recognition software and may include unintentional dictation errors.   Irean Hong, MD 08/10/18 [phone]

## 2018-08-10 NOTE — ED Notes (Signed)
Pt reports relief of nausea; MD notified

## 2018-08-10 NOTE — Discharge Instructions (Addendum)
1. Take pain & nausea medicines as needed (Percocet/Zofran #30). Make sure to take a stool softener while taking narcotic pain medicines. 2. Take Flomax 0.4mg daily x 14 days. 3. Drink plenty of bottled or filtered water daily. 4. Return to the ER for worsening symptoms, persistent vomiting, fever, difficulty breathing or other concerns.  

## 2018-08-10 NOTE — ED Notes (Signed)
Pt vomited large amount emesis; pt st pain better 2/10 but has had onset increased nausea; Dr Dolores Frame notified and med ordered

## 2018-08-10 NOTE — ED Triage Notes (Signed)
Pt to triage via w/c, appears uncomfortable; pt reports left flank/side pain since yesterday; has appt with urology in morning but pain increasing with N/V; st hx kidney stones

## 2019-07-20 IMAGING — CT CT RENAL STONE PROTOCOL
2 of 4 series · 16 of 46 positions shown, 18 images · non-contrast
Comparison: CT 03/07/2016

CLINICAL DATA: Left flank pain.

EXAM:
CT ABDOMEN AND PELVIS WITHOUT CONTRAST
TECHNIQUE: Multidetector CT imaging of the abdomen and pelvis was performed
following the standard protocol without IV contrast.

[Series 2: stone full standard · axial · 0.75mm/px · z∈[-1011,-551]mm · 13 of 102 slices shown, 15 images]
[im 5/102  soft-tissue]
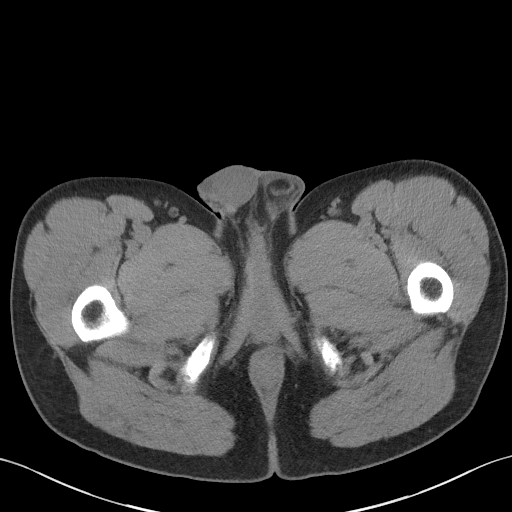
[im 5/102  bone]
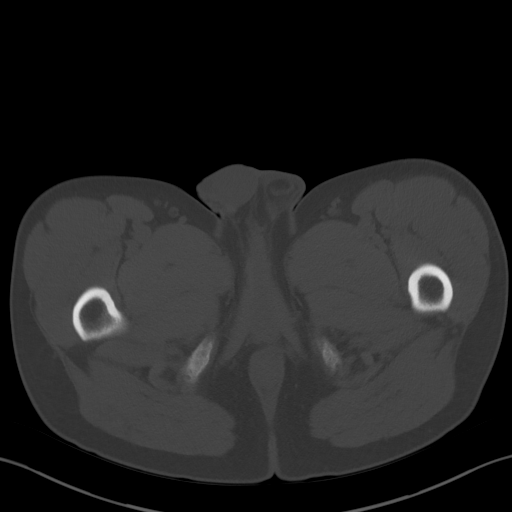
[im 14/102  soft-tissue]
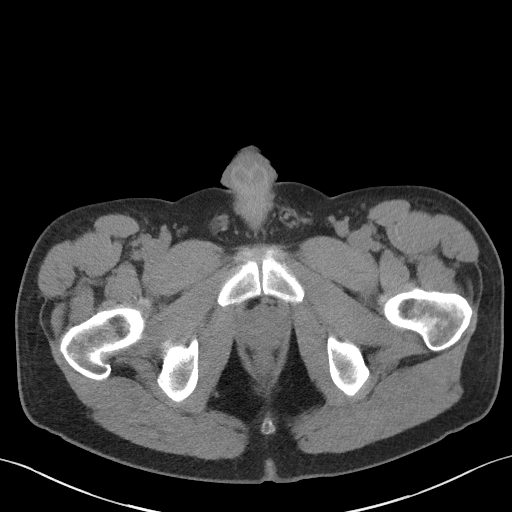
[im 22/102  soft-tissue]
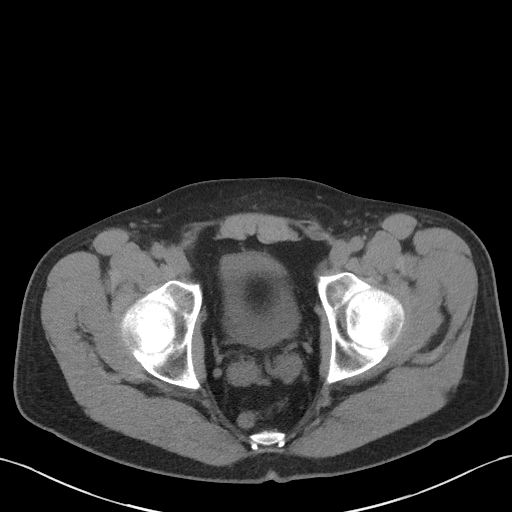
[im 27/102  soft-tissue]
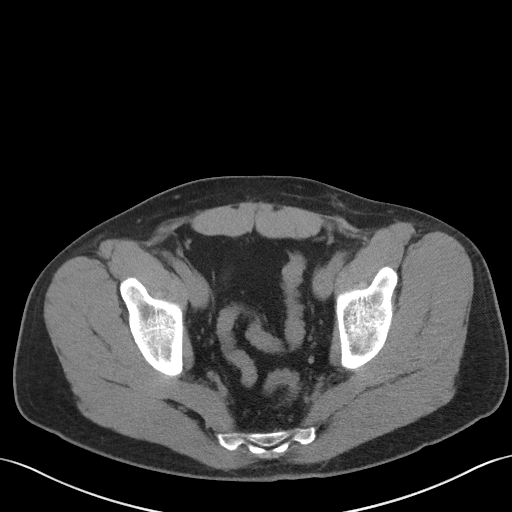
[im 36/102  soft-tissue]
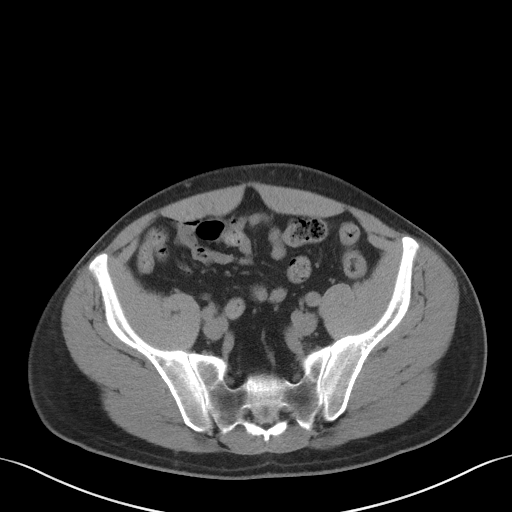
[im 44/102  soft-tissue]
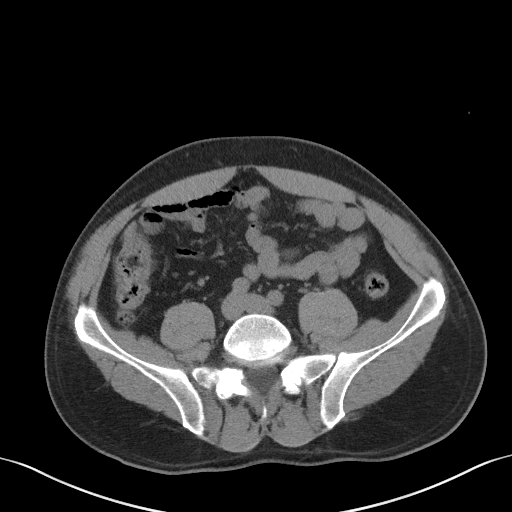
[im 53/102  soft-tissue]
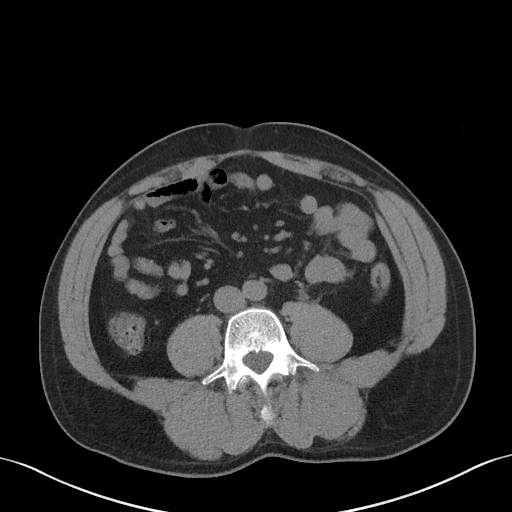
[im 58/102  soft-tissue]
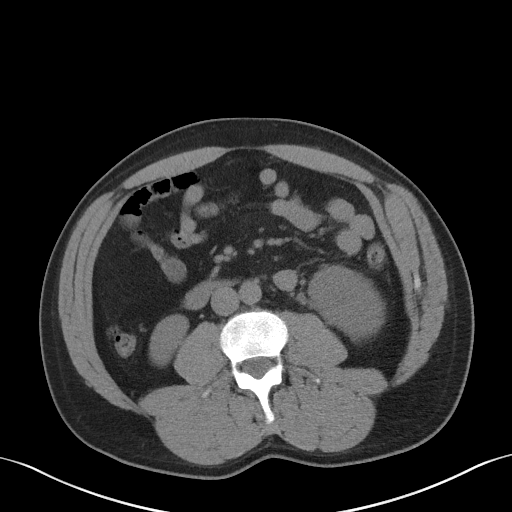
[im 66/102  soft-tissue]
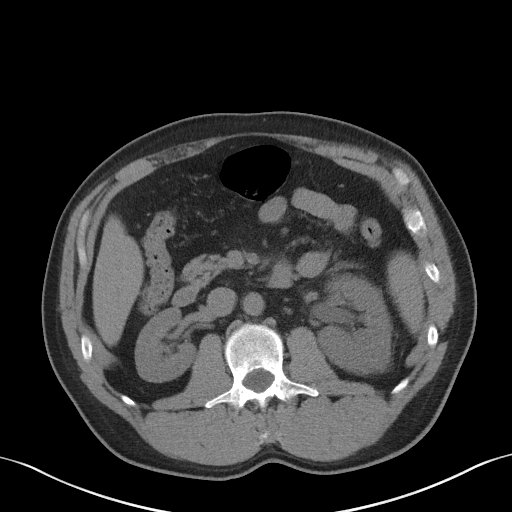
[im 66/102  bone]
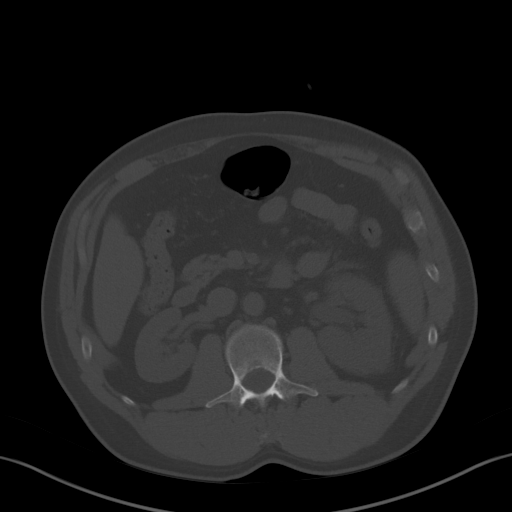
[im 75/102  soft-tissue]
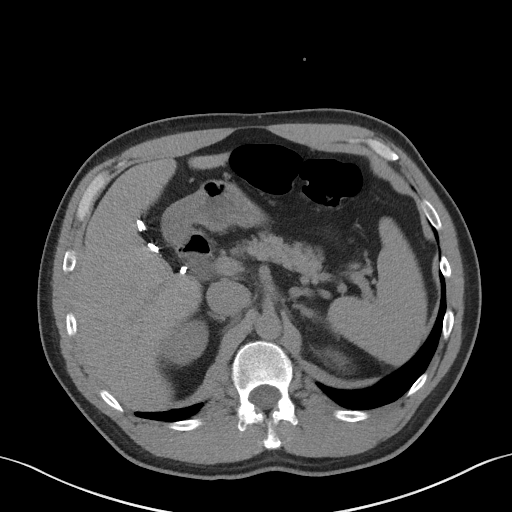
[im 80/102  soft-tissue]
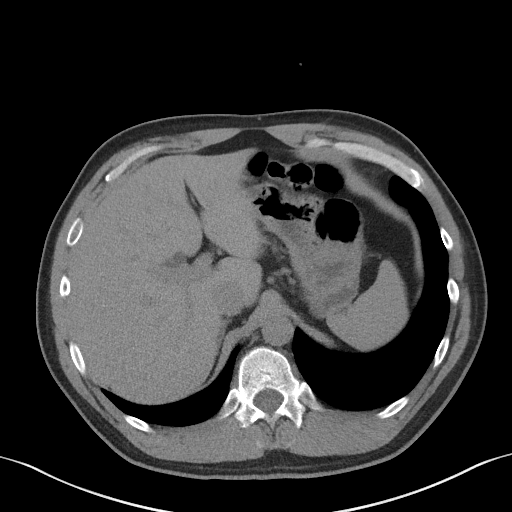
[im 88/102  soft-tissue]
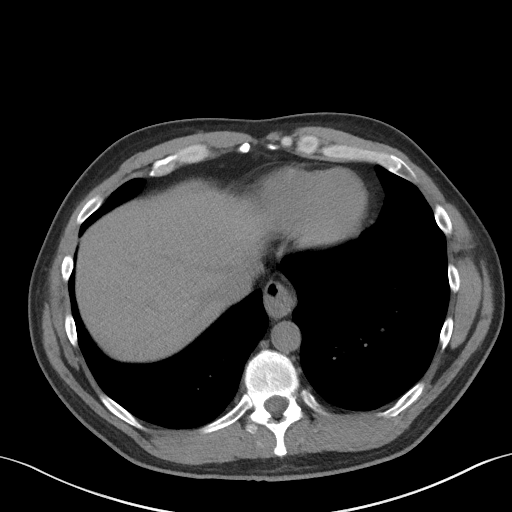
[im 97/102  soft-tissue]
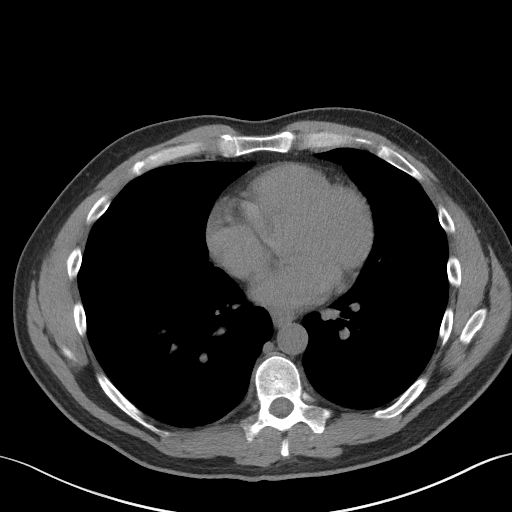

[Series 5: coronal · coronal · 0.80mm/px · 3 of 135 slices shown]
[im 45/135  soft-tissue]
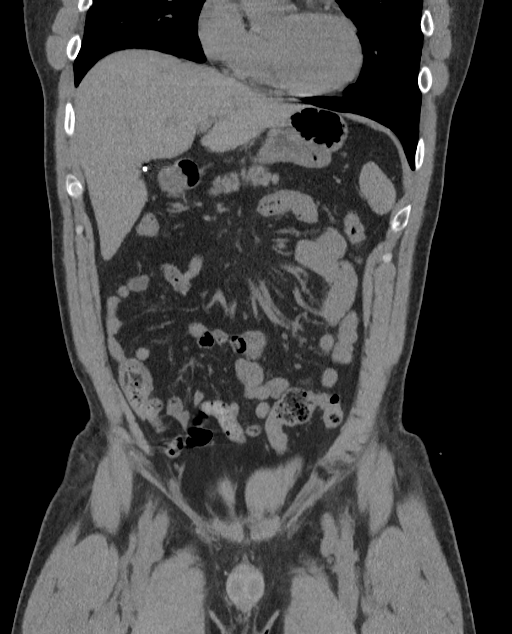
[im 60/135  soft-tissue]
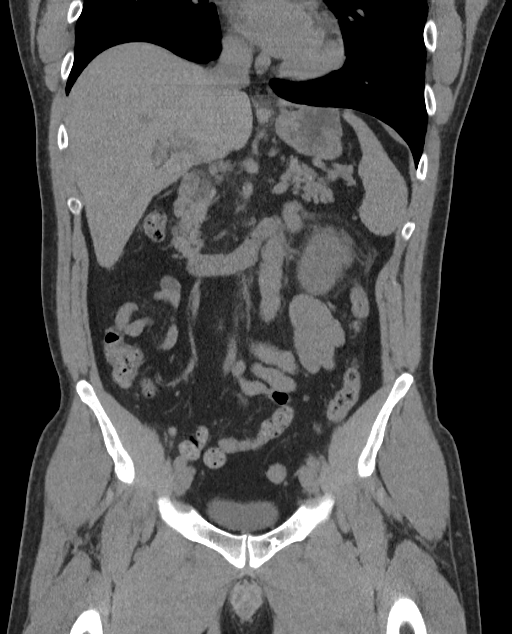
[im 75/135  soft-tissue]
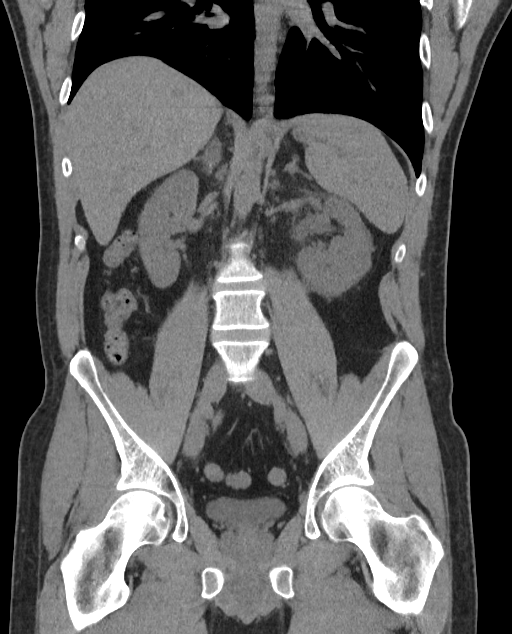

[16 of 46 positions shown; findings below may reference images not displayed]

FINDINGS: Lower chest: The lung bases are clear.

Hepatobiliary: Tiny subcentimeter low-density in the right lobe too
small to characterize but likely small cyst. Postcholecystectomy.
Proximal biliary dilatation with common bile duct measuring 16 mm
with normal tapering to the duodenal insertion. No visualized
choledocholithiasis.

Pancreas: No ductal dilatation or inflammation.

Spleen: Normal in size without focal abnormality.

Adrenals/Urinary Tract: Normal adrenal glands. Obstructing 5 x 3 mm
stone in the left mid ureter (the level of L5) with moderate
proximal hydroureteronephrosis and perinephric edema. More distal
ureter is decompressed. Additional punctate nonobstructing stones in
the left kidney. No right hydronephrosis. No definite right
urolithiasis. Urinary bladder is partially distended. No bladder
stone

Stomach/Bowel: Small hiatal hernia. Stomach partially distended. No
bowel wall thickening or inflammatory change. Normal appendix. Small
volume of stool throughout the colon.

Vascular/Lymphatic: Normal course and caliber of unopacified
vasculature. No adenopathy.

Reproductive: Prostate is unremarkable.

Other: Tiny fat containing umbilical hernia. No free air or free
fluid.

Musculoskeletal: There are no acute or suspicious osseous
abnormalities.
IMPRESSION: 1. Obstructing 5 x 3 mm stone in the left mid ureter with moderate
hydronephrosis and perinephric edema.
2. Punctate nonobstructing stones in the left kidney.
3. Small hiatal hernia
